# Patient Record
Sex: Female | Born: 1980 | Race: White | Hispanic: No | Marital: Married | Smoking: Current every day smoker
Health system: Southern US, Community
[De-identification: ages and names within clinical notes are randomized; demographics above are authoritative.]

---

## 2019-04-17 ENCOUNTER — Emergency Department: Payer: Medicaid Other

## 2019-04-17 ENCOUNTER — Other Ambulatory Visit: Payer: Self-pay

## 2019-04-17 ENCOUNTER — Emergency Department
Admission: EM | Admit: 2019-04-17 | Discharge: 2019-04-17 | Disposition: A | Discharge status: Home / Self Care | Payer: Medicaid Other | Attending: Emergency Medicine | Admitting: Emergency Medicine

## 2019-04-17 DIAGNOSIS — Z23 Encounter for immunization: Secondary | ICD-10-CM | POA: Diagnosis not present

## 2019-04-17 DIAGNOSIS — L03011 Cellulitis of right finger: Secondary | ICD-10-CM | POA: Diagnosis not present

## 2019-04-17 DIAGNOSIS — F17210 Nicotine dependence, cigarettes, uncomplicated: Secondary | ICD-10-CM | POA: Diagnosis not present

## 2019-04-17 DIAGNOSIS — L089 Local infection of the skin and subcutaneous tissue, unspecified: Secondary | ICD-10-CM | POA: Diagnosis present

## 2019-04-17 LAB — CBC WITH DIFFERENTIAL/PLATELET
Abs Immature Granulocytes: 0.06 K/uL (ref 0.00–0.07)
Basophils Absolute: 0.1 K/uL (ref 0.0–0.1)
Basophils Relative: 1 %
Eosinophils Absolute: 0.3 K/uL (ref 0.0–0.5)
Eosinophils Relative: 3 %
HCT: 40.3 % (ref 36.0–46.0)
Hemoglobin: 13.7 g/dL (ref 12.0–15.0)
Immature Granulocytes: 1 %
Lymphocytes Relative: 26 %
Lymphs Abs: 3.3 K/uL (ref 0.7–4.0)
MCH: 29.4 pg (ref 26.0–34.0)
MCHC: 34 g/dL (ref 30.0–36.0)
MCV: 86.5 fL (ref 80.0–100.0)
Monocytes Absolute: 0.9 K/uL (ref 0.1–1.0)
Monocytes Relative: 7 %
Neutro Abs: 7.8 K/uL — ABNORMAL HIGH (ref 1.7–7.7)
Neutrophils Relative %: 62 %
Platelets: 347 K/uL (ref 150–400)
RBC: 4.66 MIL/uL (ref 3.87–5.11)
RDW: 12.2 % (ref 11.5–15.5)
WBC: 12.5 K/uL — ABNORMAL HIGH (ref 4.0–10.5)
nRBC: 0 % (ref 0.0–0.2)

## 2019-04-17 LAB — COMPREHENSIVE METABOLIC PANEL
ALT: 12 U/L (ref 0–44)
AST: 11 U/L — ABNORMAL LOW (ref 15–41)
Albumin: 3.9 g/dL (ref 3.5–5.0)
Alkaline Phosphatase: 103 U/L (ref 38–126)
Anion gap: 9 (ref 5–15)
BUN: 17 mg/dL (ref 6–20)
CO2: 27 mmol/L (ref 22–32)
Calcium: 9.3 mg/dL (ref 8.9–10.3)
Chloride: 101 mmol/L (ref 98–111)
Creatinine, Ser: 0.52 mg/dL (ref 0.44–1.00)
GFR calc Af Amer: >60 mL/min (ref >60)
GFR calc non Af Amer: >60 mL/min (ref >60)
Glucose, Bld: 93 mg/dL (ref 70–99)
Potassium: 3.1 mmol/L — ABNORMAL LOW (ref 3.5–5.1)
Sodium: 137 mmol/L (ref 135–145)
Total Bilirubin: 0.8 mg/dL (ref 0.3–1.2)
Total Protein: 7.6 g/dL (ref 6.5–8.1)

## 2019-04-17 MED ORDER — HYDROCODONE-ACETAMINOPHEN 5-325 MG PO TABS
1.0000 | ORAL_TABLET | Freq: Once | ORAL | Status: AC
  Administered 2019-04-17: 1 via ORAL
  Filled 2019-04-17: qty 1

## 2019-04-17 MED ORDER — SULFAMETHOXAZOLE-TRIMETHOPRIM 800-160 MG PO TABS
1.0000 | ORAL_TABLET | Freq: Once | ORAL | Status: AC
  Administered 2019-04-17: 16:00:00 1 via ORAL
  Filled 2019-04-17: qty 1

## 2019-04-17 MED ORDER — LIDOCAINE HCL (PF) 1 % IJ SOLN
5.0000 mL | Freq: Once | INTRAMUSCULAR | Status: AC
  Administered 2019-04-17: 5 mL
  Filled 2019-04-17: qty 5

## 2019-04-17 MED ORDER — CEPHALEXIN 500 MG PO CAPS: 500.0000 mg | ORAL_CAPSULE | Freq: Three times a day (TID) | ORAL | 0 refills | Status: AC

## 2019-04-17 MED ORDER — HYDROCODONE-ACETAMINOPHEN 5-325 MG PO TABS: 1.0000 | ORAL_TABLET | Freq: Three times a day (TID) | ORAL | 0 refills | Status: AC | PRN

## 2019-04-17 MED ORDER — BUPIVACAINE HCL (PF) 0.5 % IJ SOLN
10.0000 mL | Freq: Once | INTRAMUSCULAR | Status: AC
  Administered 2019-04-17: 16:00:00 10 mL
  Filled 2019-04-17: qty 10

## 2019-04-17 MED ORDER — TETANUS-DIPHTH-ACELL PERTUSSIS 5-2.5-18.5 LF-MCG/0.5 IM SUSP
0.5000 mL | Freq: Once | INTRAMUSCULAR | Status: AC
  Administered 2019-04-17: 16:00:00 0.5 mL via INTRAMUSCULAR
  Filled 2019-04-17: qty 0.5

## 2019-04-17 MED ORDER — SULFAMETHOXAZOLE-TRIMETHOPRIM 800-160 MG PO TABS: 1.0000 | ORAL_TABLET | Freq: Two times a day (BID) | ORAL | 0 refills | Status: DC

## 2019-04-17 MED ORDER — CEPHALEXIN 500 MG PO CAPS
500.0000 mg | ORAL_CAPSULE | Freq: Once | ORAL | Status: AC
  Administered 2019-04-17: 16:00:00 500 mg via ORAL
  Filled 2019-04-17: qty 1

## 2019-04-17 NOTE — ED Triage Notes (Addendum)
Pt reports pain/swelling to right middle finger x2-3 days ago - the right middle finger appears to have discolored (yellow/green) blister to finger that is causing nail to start and protrude up - pt reports the nail/finger are painful and itching - pt reports that the pain and throbbing are extending up into her hand/wrist

## 2019-04-17 NOTE — ED Provider Notes (Signed)
[facility] Emergency Department Provider Note ____________________________________________  Time seen: 1456  I have reviewed the triage vital signs and the nursing notes.  HISTORY  Chief Complaint  Hand Pain  HPI Hannah Morse is a 39 y.o.right-handed female presents herself to the ED for evaluation of infection to the right middle fingertip. She admits to nail and cuticle biting. She presents with an infection which started at the cuticle and quickly spread to the entire dorsal fingertip, and now notes some tenderness to the fat pad. She denies any fevers, chills, or sweats.   See MEDIA tab - photos labeled Right Middle Finger 1 & 2  History reviewed. No pertinent past medical history.  There are no problems to display for this patient.  History reviewed. No pertinent surgical history.  Prior to Admission medications   Medication Sig Start Date End Date Taking? Authorizing Provider  cephALEXin (KEFLEX) 500 MG capsule Take 1 capsule (500 mg total) by mouth 3 (three) times daily for 7 days. 04/17/19 04/24/19  Galdino Hinchman, Charlesetta Ivory, PA-C  HYDROcodone-acetaminophen (NORCO) 5-325 MG tablet Take 1 tablet by mouth 3 (three) times daily as needed for up to 2 days. 04/17/19 04/19/19  Marck Mcclenny, Charlesetta Ivory, PA-C  sulfamethoxazole-trimethoprim (BACTRIM DS) 800-160 MG tablet Take 1 tablet by mouth 2 (two) times daily. 04/17/19   Lemuel Boodram, Charlesetta Ivory, PA-C    Allergies Patient has no known allergies.  History reviewed. No pertinent family history.  Social History Social History   Tobacco Use  . Smoking status: Current Every Day Smoker    Packs/day: 0.50    Types: Cigarettes  . Smokeless tobacco: Never Used  Substance Use Topics  . Alcohol use: Never  . Drug use: Yes    Types: Marijuana    Comment: last used 1 month ago     Review of Systems  Constitutional: Negative for fever. Cardiovascular: Negative for chest pain. Respiratory: Negative for  shortness of breath. Musculoskeletal: Negative for back pain. Skin: Negative for rash. Fingertip infection as above.  Neurological: Negative for headaches, focal weakness or numbness. ____________________________________________  PHYSICAL EXAM:  VITAL SIGNS: ED Triage Vitals  Enc Vitals Group     BP 04/17/19 1435 (!) 154/100     Pulse Rate 04/17/19 1435 93     Resp 04/17/19 1435 15     Temp 04/17/19 1435 98.2 F (36.8 C)     Temp Source 04/17/19 1435 Oral     SpO2 04/17/19 1435 100 %     Weight 04/17/19 1431 160 lb (72.6 kg)     Height 04/17/19 1431 5\' 4"  (1.626 m)     Head Circumference --      Peak Flow --      Pain Score 04/17/19 1431 6     Pain Loc --      Pain Edu? --      Excl. in GC? --     Constitutional: Alert and oriented. Well appearing and in no distress. Head: Normocephalic and atraumatic. Eyes: Conjunctivae are normal. Normal extraocular movements Cardiovascular: Normal rate, regular rhythm. Normal distal pulses. Respiratory: Normal respiratory effort.  Musculoskeletal: Nontender with normal range of motion in all extremities. Normal composite fist on the right hand Neurologic:  Normal gait without ataxia. Normal speech and language. No gross focal neurologic deficits are appreciated. Skin:  Skin is warm, dry and intact. No rash noted. Right middle finger with a large paronychia noted to the distal phalanx dorsally. It extends medially and laterally, and  causes some tension to the palmar fat pad. No lymphangitis, streaking, or spontaneous drainage noted.  Psychiatric: Mood and affect are normal. Patient exhibits appropriate insight and judgment. ____________________________________________   LABS (pertinent positives/negatives)  Labs Reviewed  CBC WITH DIFFERENTIAL/PLATELET - Abnormal; Notable for the following components:      Result Value   WBC 12.5 (*)    Neutro Abs 7.8 (*)    All other components within normal limits  COMPREHENSIVE METABOLIC PANEL -  Abnormal; Notable for the following components:   Potassium 3.1 (*)    AST 11 (*)    All other components within normal limits  AEROBIC CULTURE (SUPERFICIAL SPECIMEN)  ____________________________________________  RADIOLOGY  DG Right Middle Finger  IMPRESSION: Severe soft tissue swelling of the distal third finger. No bony abnormality identified. No foreign body.  I, Lissa Hoard, personally viewed and evaluated these images (plain radiographs) as part of my medical decision making, as well as reviewing the written report by the radiologist. ____________________________________________  PROCEDURES  Tdap 0.5 ml IM Bactrim DS 1 PO Keflex 500 mg PO Norco 5-325 mg PO  .Marland KitchenIncision and Drainage  Date/Time: 04/17/2019 5:21 PM Performed by: Lissa Hoard, PA-C Authorized by: Lissa Hoard, PA-C   Consent:    Consent obtained:  Verbal   Consent given by:  Patient   Risks discussed:  Bleeding, infection, incomplete drainage and pain   Alternatives discussed:  Alternative treatment, delayed treatment and observation Location:    Type:  Abscess (paronychia/felon)   Location:  Upper extremity   Upper extremity location:  Finger   Finger location:  R long finger Pre-procedure details:    Skin preparation:  Betadine Anesthesia (see MAR for exact dosages):    Anesthesia method:  Nerve block (transthecal block)   Block location:  Flexor tendon sheath   Block needle gauge:  27 G   Block anesthetic:  Bupivacaine 0.5% w/o epi and lidocaine 1% w/o epi   Block technique:  Flexon tendon sheath   Block injection procedure:  Anatomic landmarks identified, anatomic landmarks palpated, introduced needle and incremental injection   Block outcome:  Anesthesia achieved Procedure type:    Complexity:  Complex Procedure details:    Needle aspiration: no     Incision types:  Single straight (fat pad (medial & lateral) & paronychia (dorsally))   Incision depth:   Subcutaneous   Scalpel blade:  11   Wound management:  Probed and deloculated   Drainage:  Purulent   Drainage amount:  Moderate   Wound treatment:  Wound left open   Packing materials:  None Post-procedure details:    Patient tolerance of procedure:  Tolerated well, no immediate complications   ____________________________________________  INITIAL IMPRESSION / ASSESSMENT AND PLAN / ED COURSE  Patient presents to the ED for evaluation of infection to the right middle finger.  She presents with a moderate paronychia to the right middle finger dorsally concerning for early felon to the fat pad.  Patient agrees to undergo a local I&D procedure which nets meaningful improvement to her paronychia.  She is also has 2 drainage lines placed to the fat pad for felon management.  She will be treated empirically with Keflex and Bactrim for infection management.  A small prescription for hydrocodone provided for pain control.  She is discharged with wound care instructions and supplies, and will monitor the wound closely.  She will return to the ED immediately for any signs of worsening infection.  Eliyanah Elgersma was  evaluated in Emergency Department on 04/17/2019 for the symptoms described in the history of present illness. She was evaluated in the context of the global COVID-19 pandemic, which necessitated consideration that the patient might be at risk for infection with the SARS-CoV-2 virus that causes COVID-19. Institutional protocols and algorithms that pertain to the evaluation of patients at risk for COVID-19 are in a state of rapid change based on information released by regulatory bodies including the CDC and federal and state organizations. These policies and algorithms were followed during the patient's care in the ED.  No current RX noted on the [place] Controlled Substances Database. ____________________________________________  FINAL CLINICAL IMPRESSION(S) / ED DIAGNOSES  Final diagnoses:   Paronychia of right middle finger  Felon of finger of right hand      Carmie End, Dannielle Karvonen, PA-C 04/17/19 1832    Harvest Dark, MD 04/19/19 2127

## 2019-04-17 NOTE — Discharge Instructions (Signed)
You have had your finger infection treated with an incision and drainage procedure. You should keep the finger clean, dry, and covered. Change the dressing daily. Perform saline soaks to promote healing. Take the two antibiotics as directed and the pain medicine as needed. Return to the ED for wound check as discussed.

## 2019-04-17 NOTE — ED Notes (Signed)
Discussed pt with [facility] - order to start IV/collect blood and order xray of finger then flex

## 2019-04-19 LAB — AEROBIC CULTURE W GRAM STAIN (SUPERFICIAL SPECIMEN)

## 2020-04-13 ENCOUNTER — Emergency Department
Admission: EM | Admit: 2020-04-13 | Discharge: 2020-04-13 | Disposition: A | Discharge status: Home / Self Care | Payer: Medicaid Other | Attending: Emergency Medicine | Admitting: Emergency Medicine

## 2020-04-13 ENCOUNTER — Other Ambulatory Visit: Payer: Self-pay

## 2020-04-13 DIAGNOSIS — F1510 Other stimulant abuse, uncomplicated: Secondary | ICD-10-CM | POA: Diagnosis not present

## 2020-04-13 DIAGNOSIS — R197 Diarrhea, unspecified: Secondary | ICD-10-CM | POA: Diagnosis present

## 2020-04-13 DIAGNOSIS — F17210 Nicotine dependence, cigarettes, uncomplicated: Secondary | ICD-10-CM | POA: Diagnosis not present

## 2020-04-13 DIAGNOSIS — Z20822 Contact with and (suspected) exposure to covid-19: Secondary | ICD-10-CM | POA: Diagnosis not present

## 2020-04-13 DIAGNOSIS — K529 Noninfective gastroenteritis and colitis, unspecified: Secondary | ICD-10-CM | POA: Insufficient documentation

## 2020-04-13 DIAGNOSIS — E876 Hypokalemia: Secondary | ICD-10-CM | POA: Insufficient documentation

## 2020-04-13 DIAGNOSIS — E860 Dehydration: Secondary | ICD-10-CM | POA: Insufficient documentation

## 2020-04-13 LAB — URINALYSIS, COMPLETE (UACMP) WITH MICROSCOPIC
Bilirubin Urine: NEGATIVE
Glucose, UA: NEGATIVE mg/dL
Hgb urine dipstick: NEGATIVE
Ketones, ur: NEGATIVE mg/dL
Leukocytes,Ua: NEGATIVE
Nitrite: NEGATIVE
Protein, ur: NEGATIVE mg/dL
Specific Gravity, Urine: 1.002 — ABNORMAL LOW (ref 1.005–1.030)
pH: 5 (ref 5.0–8.0)

## 2020-04-13 LAB — CBC WITH DIFFERENTIAL/PLATELET
Abs Immature Granulocytes: 0.02 10*3/uL (ref 0.00–0.07)
Basophils Absolute: 0.1 10*3/uL (ref 0.0–0.1)
Basophils Relative: 1 %
Eosinophils Absolute: 0.1 10*3/uL (ref 0.0–0.5)
Eosinophils Relative: 1 %
HCT: 39.6 % (ref 36.0–46.0)
Hemoglobin: 13.7 g/dL (ref 12.0–15.0)
Immature Granulocytes: 0 %
Lymphocytes Relative: 15 %
Lymphs Abs: 1.2 10*3/uL (ref 0.7–4.0)
MCH: 30 pg (ref 26.0–34.0)
MCHC: 34.6 g/dL (ref 30.0–36.0)
MCV: 86.7 fL (ref 80.0–100.0)
Monocytes Absolute: 1.2 10*3/uL — ABNORMAL HIGH (ref 0.1–1.0)
Monocytes Relative: 14 %
Neutro Abs: 5.6 10*3/uL (ref 1.7–7.7)
Neutrophils Relative %: 69 %
Platelets: 377 10*3/uL (ref 150–400)
RBC: 4.57 MIL/uL (ref 3.87–5.11)
RDW: 12.6 % (ref 11.5–15.5)
Smear Review: NORMAL
WBC: 8.1 10*3/uL (ref 4.0–10.5)
nRBC: 0 % (ref 0.0–0.2)

## 2020-04-13 LAB — COMPREHENSIVE METABOLIC PANEL
ALT: 14 U/L (ref 0–44)
AST: 16 U/L (ref 15–41)
Albumin: 3.8 g/dL (ref 3.5–5.0)
Alkaline Phosphatase: 79 U/L (ref 38–126)
Anion gap: 11 (ref 5–15)
BUN: 18 mg/dL (ref 6–20)
CO2: 24 mmol/L (ref 22–32)
Calcium: 9.1 mg/dL (ref 8.9–10.3)
Chloride: 98 mmol/L (ref 98–111)
Creatinine, Ser: 0.71 mg/dL (ref 0.44–1.00)
GFR, Estimated: >60 mL/min (ref >60)
Glucose, Bld: 98 mg/dL (ref 70–99)
Potassium: 3.4 mmol/L — ABNORMAL LOW (ref 3.5–5.1)
Sodium: 133 mmol/L — ABNORMAL LOW (ref 135–145)
Total Bilirubin: 1.2 mg/dL (ref 0.3–1.2)
Total Protein: 7.4 g/dL (ref 6.5–8.1)

## 2020-04-13 LAB — LIPASE, BLOOD: Lipase: 22 U/L (ref 11–51)

## 2020-04-13 LAB — LACTIC ACID, PLASMA: Lactic Acid, Venous: 1.5 mmol/L (ref 0.5–1.9)

## 2020-04-13 LAB — SARS CORONAVIRUS 2 BY RT PCR (HOSPITAL ORDER, PERFORMED IN ~~LOC~~ HOSPITAL LAB): SARS Coronavirus 2: NEGATIVE

## 2020-04-13 LAB — TROPONIN I (HIGH SENSITIVITY): Troponin I (High Sensitivity): 8 ng/L (ref <18)

## 2020-04-13 LAB — MAGNESIUM: Magnesium: 1.6 mg/dL — ABNORMAL LOW (ref 1.7–2.4)

## 2020-04-13 LAB — HIV ANTIBODY (ROUTINE TESTING W REFLEX): HIV Screen 4th Generation wRfx: NONREACTIVE

## 2020-04-13 LAB — POC URINE PREG, ED: Preg Test, Ur: NEGATIVE

## 2020-04-13 MED ORDER — POTASSIUM CHLORIDE CRYS ER 20 MEQ PO TBCR
40.0000 meq | EXTENDED_RELEASE_TABLET | Freq: Once | ORAL | Status: AC
  Administered 2020-04-13: 40 meq via ORAL
  Filled 2020-04-13: qty 2

## 2020-04-13 MED ORDER — MAGNESIUM SULFATE 2 GM/50ML IV SOLN
2.0000 g | Freq: Once | INTRAVENOUS | Status: AC
  Administered 2020-04-13: 2 g via INTRAVENOUS
  Filled 2020-04-13: qty 50

## 2020-04-13 MED ORDER — ACETAMINOPHEN 325 MG PO TABS
650.0000 mg | ORAL_TABLET | Freq: Once | ORAL | Status: AC | PRN
  Administered 2020-04-13: 650 mg via ORAL
  Filled 2020-04-13: qty 2

## 2020-04-13 MED ORDER — ONDANSETRON HCL 4 MG PO TABS: 4.0000 mg | ORAL_TABLET | Freq: Three times a day (TID) | ORAL | 0 refills | Status: AC | PRN

## 2020-04-13 MED ORDER — ONDANSETRON 4 MG PO TBDP
4.0000 mg | ORAL_TABLET | Freq: Once | ORAL | Status: AC
  Administered 2020-04-13: 4 mg via ORAL
  Filled 2020-04-13: qty 1

## 2020-04-13 MED ORDER — LACTATED RINGERS IV BOLUS (SEPSIS)
30.0000 mL/kg | Freq: Once | INTRAVENOUS | Status: AC
  Administered 2020-04-13: 2040 mL via INTRAVENOUS

## 2020-04-13 NOTE — ED Triage Notes (Signed)
Reports NVD and generalized body aching that started approx 24 hours ago.

## 2020-04-13 NOTE — ED Notes (Signed)
D/C and new RX discussed with pt, pt verbalized understanding. NAD noted. Pt denies any further questions for this RN. Pt educated on stopping meth use.

## 2020-04-13 NOTE — ED Provider Notes (Signed)
[facility] Emergency Department Provider Note  ____________________________________________   Event Date/Time   First MD Initiated Contact with Patient 04/13/20 [phone]     (approximate)  I have reviewed the triage vital signs and the nursing notes.   HISTORY  Chief Complaint Emesis and Diarrhea   HPI Hannah Morse is a 40 y.o. female with a past medical history of methamphetamine abuse stating she last used about 1 week ago as well as chronic soreness in her right shoulder with the last 2 years who presents for assessment of approximately 6 to 8 hours of nonbloody nonbilious vomiting, nonbloody diarrhea, myalgias, fevers and general malaise.  She denies any headache, earache, sore throat, cough, chest pain, shortness of breath, dental pain, back pain, rash, urinary symptoms, or any other acute sick symptoms.  Denies any recent injuries or falls.  She denies regular EtOH use or other illicit drug use.  She does no tobacco abuse.  No clearly getting a rating factors.  Patient does note she is homeless.         History reviewed. No pertinent past medical history.  There are no problems to display for this patient.   History reviewed. No pertinent surgical history.  Prior to Admission medications   Medication Sig Start Date End Date Taking? Authorizing Provider  ondansetron (ZOFRAN) 4 MG tablet Take 1 tablet (4 mg total) by mouth every 8 (eight) hours as needed for up to 10 doses for nausea or vomiting. 04/13/20  Yes Gilles Chiquito, MD    Allergies Patient has no known allergies.  No family history on file.  Social History Social History   Tobacco Use  . Smoking status: Current Every Day Smoker    Packs/day: 0.50    Types: Cigarettes  . Smokeless tobacco: Never Used  Substance Use Topics  . Alcohol use: Never  . Drug use: Yes    Types: Marijuana    Comment: last used 1 month ago     Review of Systems  Review of Systems   Constitutional: Positive for chills, fever and malaise/fatigue.  HENT: Negative for sore throat.   Eyes: Negative for pain.  Respiratory: Negative for cough and stridor.   Cardiovascular: Negative for chest pain.  Gastrointestinal: Positive for diarrhea, nausea and vomiting.  Genitourinary: Negative for dysuria.  Musculoskeletal: Positive for myalgias.  Skin: Negative for rash.  Neurological: Negative for seizures, loss of consciousness and headaches.  Psychiatric/Behavioral: Negative for suicidal ideas.  All other systems reviewed and are negative.     ____________________________________________   PHYSICAL EXAM:  VITAL SIGNS: ED Triage Vitals [04/13/20 0829]  Enc Vitals Group     BP (!) 102/54     Pulse Rate (!) 101     Resp 18     Temp (!) 101.2 F (38.4 C)     Temp Source Oral     SpO2 100 %     Weight 150 lb (68 kg)     Height 5\' 4"  (1.626 m)     Head Circumference      Peak Flow      Pain Score 6     Pain Loc      Pain Edu?      Excl. in GC?    Vitals:   04/13/20 1230 04/13/20 1300  BP: 131/78 119/79  Pulse: 92 93  Resp: (!) 25 (!) 26  Temp:    SpO2: 100% 100%   Physical Exam Vitals and nursing note reviewed.  Constitutional:  General: She is not in acute distress.    Appearance: She is well-developed and well-nourished. She is ill-appearing.  HENT:     Head: Normocephalic and atraumatic.     Right Ear: External ear normal.     Left Ear: External ear normal.     Nose: Nose normal.     Mouth/Throat:     Mouth: Mucous membranes are dry.  Eyes:     Conjunctiva/sclera: Conjunctivae normal.  Cardiovascular:     Rate and Rhythm: Normal rate and regular rhythm.     Heart sounds: No murmur heard.   Pulmonary:     Effort: Pulmonary effort is normal. No respiratory distress.     Breath sounds: Normal breath sounds.  Abdominal:     Palpations: Abdomen is soft.     Tenderness: There is no abdominal tenderness. There is no right CVA tenderness or  left CVA tenderness.  Musculoskeletal:        General: No edema.     Cervical back: Neck supple.  Skin:    General: Skin is warm and dry.     Capillary Refill: Capillary refill takes more than 3 seconds.  Neurological:     Mental Status: She is alert and oriented to person, place, and time.  Psychiatric:        Mood and Affect: Mood and affect and mood normal.      ____________________________________________   LABS (all labs ordered are listed, but only abnormal results are displayed)  Labs Reviewed  CBC WITH DIFFERENTIAL/PLATELET - Abnormal; Notable for the following components:      Result Value   Monocytes Absolute 1.2 (*)    All other components within normal limits  COMPREHENSIVE METABOLIC PANEL - Abnormal; Notable for the following components:   Sodium 133 (*)    Potassium 3.4 (*)    All other components within normal limits  URINALYSIS, COMPLETE (UACMP) WITH MICROSCOPIC - Abnormal; Notable for the following components:   Color, Urine STRAW (*)    APPearance CLEAR (*)    Specific Gravity, Urine 1.002 (*)    Bacteria, UA RARE (*)    All other components within normal limits  MAGNESIUM - Abnormal; Notable for the following components:   Magnesium 1.6 (*)    All other components within normal limits  SARS CORONAVIRUS 2 BY RT PCR (HOSPITAL ORDER, PERFORMED IN [facility])  LIPASE, BLOOD  LACTIC ACID, PLASMA  HIV ANTIBODY (ROUTINE TESTING W REFLEX)  POC URINE PREG, ED  TROPONIN I (HIGH SENSITIVITY)  TROPONIN I (HIGH SENSITIVITY)   ____________________________________________  EKG  Sinus rhythm with a ventricular of 94, normal axis, normal intervals, and some ST changes that are nonspecific in anterior and inferior leads.  No other significant arrhythmia. ____________________________________________  RADIOLOGY  ED MD interpretation:  Official radiology report(s): No results  found.  ____________________________________________   PROCEDURES  Procedure(s) performed (including Critical Care):  .1-3 Lead EKG Interpretation Performed by: Gilles Chiquito, MD Authorized by: Gilles Chiquito, MD     Interpretation: normal     ECG rate assessment: normal     Rhythm: sinus rhythm     Ectopy: none     Conduction: normal       ____________________________________________   INITIAL IMPRESSION / ASSESSMENT AND PLAN / ED COURSE      Patient presents above to history exam for assessment of acute onset of nausea vomiting diarrhea associate with myalgias and fever.  On arrival she is febrile to 1.2, tachycardic  to 101 and borderline hypotensive with a BP of 102/54 with otherwise stable vital signs on room air.  She does appear very dry with dry mucous membranes and decreased capillary refill.  Oropharynx is unremarkable.  PERRLA.  EOMI.  Lungs clear bilaterally and abdomen soft nontender.  Upper extremities unremarkable for evidence of cellulitis states he injects no other obvious foci of infection on exam.  Differential includes but is not limited to infectious gastroenteritis, cholecystitis, pancreatitis, diverticulitis, appendicitis, pyelonephritis and cystitis.  No cough shortness of breath or abnormal lung sounds to suggest acute bacterial pneumonia at this time.  Will ECG does have some nonspecific changes patient adamantly denies any chest pain given nonelevated troponin obtained greater than 2 hours after symptom onset low suspicion for ACS or myocarditis at this time.  CBC shows no leukocytosis or acute anemia.  Otherwise unremarkable.  CMP shows no significant electrolyte or metabolic derangements aside from mild hypokalemia.  This was repleted.  No evidence of cholestasis hepatitis or other abnormalities.  Lipase 22 not consistent with Pancreatitis.  Magnesium is 1.6.  This was repleted.  Urine transit test is negative.  Lactic acid is less than 3.  Low  suspicion for sepsis or bacteremia at this time.  Covid is negative.  Impression is likely dehydration secondary to viral infectious gastroenteritis.  Patient given IV fluids electrolytes Zofran and on reassessment had resolution of her fever and tachycardia..  Improvement in vital signs with reassuring exam and work-up patient stating she felt much better I believe she is safe for discharge plan for outpatient follow-up.  Discussed that she is a pending HIV test and she can follow-up as result online or with her PCP.  Advised close outpatient PCP follow-up.  Discharged stable condition.  Strict precautions advised and discussed.  Advised to discontinue any methamphetamine use.  ____________________________________________   FINAL CLINICAL IMPRESSION(S) / ED DIAGNOSES  Final diagnoses:  Gastroenteritis  Dehydration  Hypomagnesemia  Hypokalemia  Methamphetamine abuse (HCC)    Medications  acetaminophen (TYLENOL) tablet 650 mg (650 mg Oral Given 04/13/20 0846)  ondansetron (ZOFRAN-ODT) disintegrating tablet 4 mg (4 mg Oral Given 04/13/20 0847)  lactated ringers bolus 2,040 mL (0 mL/kg  68 kg Intravenous Stopped 04/13/20 1222)  magnesium sulfate IVPB 2 g 50 mL (0 g Intravenous Stopped 04/13/20 1222)  potassium chloride SA (KLOR-CON) CR tablet 40 mEq (40 mEq Oral Given 04/13/20 1106)     ED Discharge Orders         Ordered    ondansetron (ZOFRAN) 4 MG tablet  Every 8 hours PRN        04/13/20 1308           Note:  This document was prepared using Dragon voice recognition software and may include unintentional dictation errors.   Gilles Chiquito, MD 04/13/20 [phone]

## 2020-04-13 NOTE — Discharge Instructions (Signed)
I suspect you likely have a viral infection.  Please follow-up results of your pending HIV test with your primary care doctor you may look up the results online.  Strongly recommend you discontinue any methamphetamine use discussed with your primary care doctor possible counseling regarding substance abuse.  Please return immediately to the emergency room if you experience any new symptoms or acute worsening of your current symptoms.

## 2020-04-13 NOTE — ED Notes (Signed)
First Rn note:  Pt comes into the ED via [facility] c/o N/V/D.  Pt states it started yesterday.  Denies any abdominal pain.  132/84, 116 HR, 98% RA.

## 2020-04-13 NOTE — ED Notes (Addendum)
Pt presents to the ED for generalized weakness, fatigue, NVD. Pt states she has been feeling like this since yesterday. On assessment, pt is very lethargic. Pt keeps repeating, "Im just tired and dehydrated."

## 2021-05-11 IMAGING — DX DG FINGER MIDDLE 2+V*R*
3 series · 3 of 3 positions shown · non-contrast
Comparison: None.

CLINICAL DATA: Swelling and pain.

EXAM:
RIGHT MIDDLE FINGER 2+V

[finger ap]
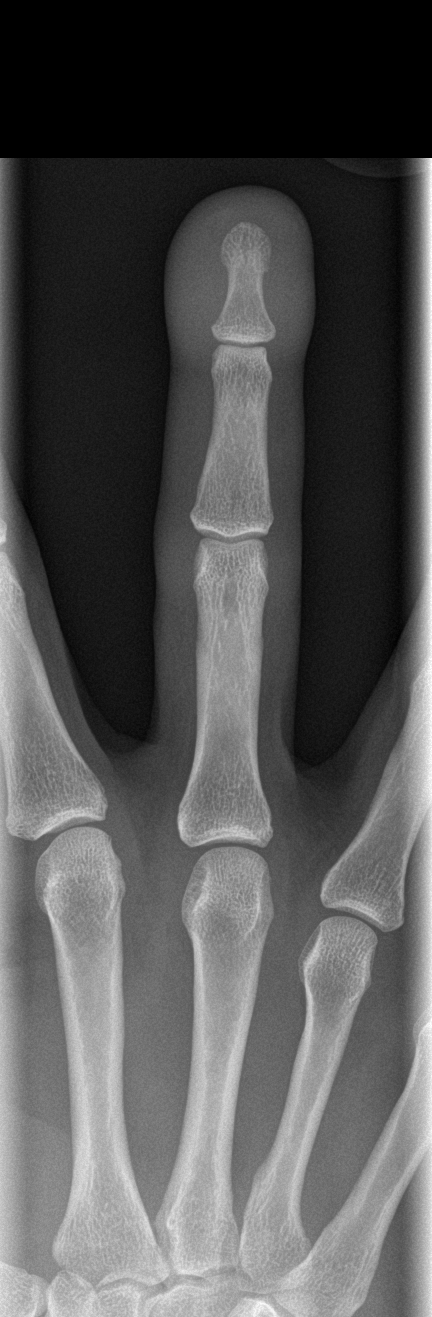

[finger obl]
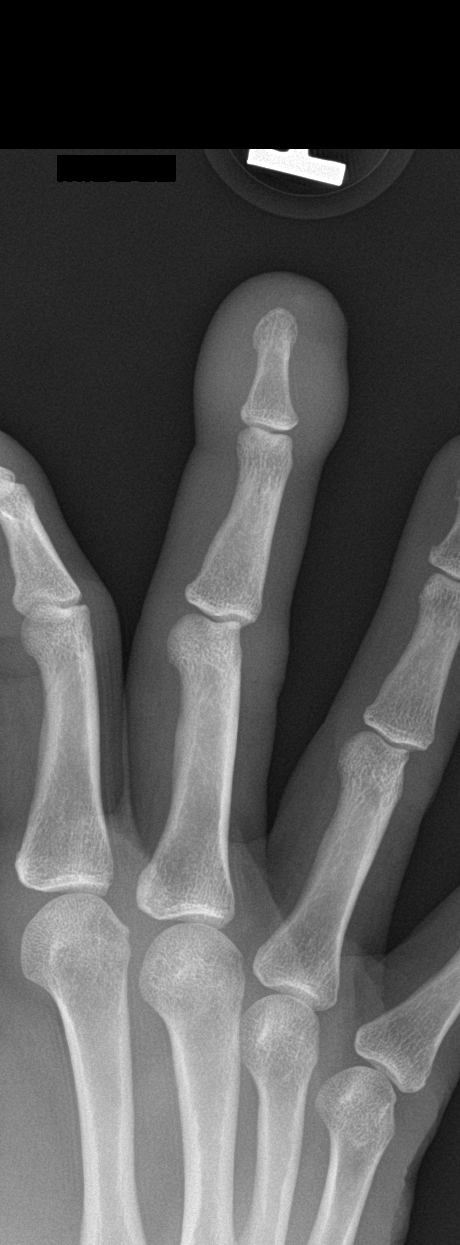

[finger lat]
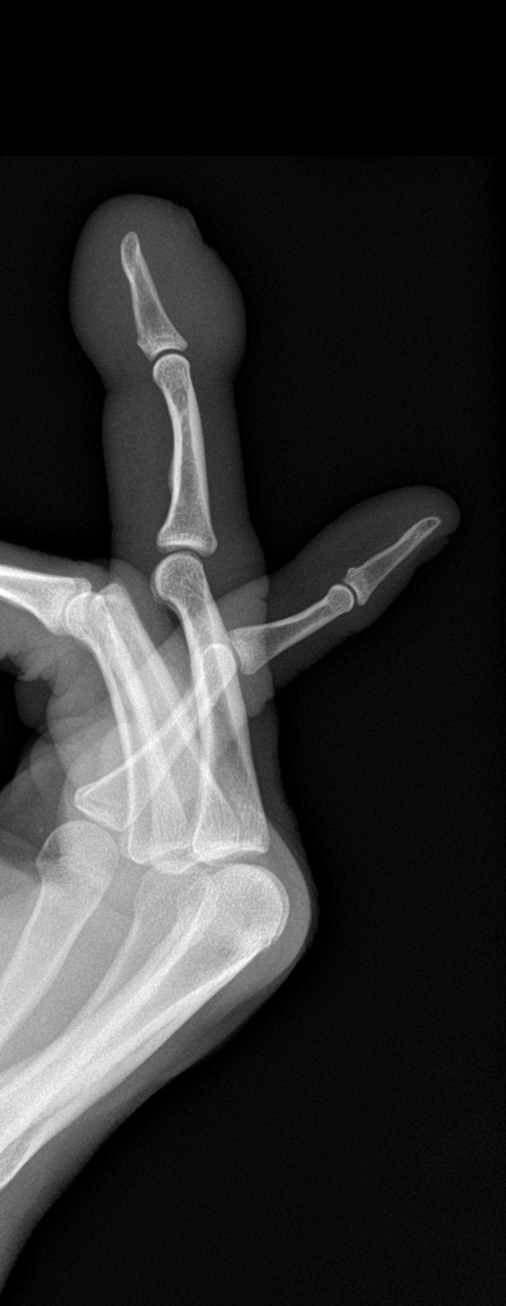

[3 of 3 positions shown; findings below may reference images not displayed]

FINDINGS: Marked soft tissue swelling is seen in the distal third finger. No
foreign body or soft tissue gas is identified. The underlying bone
is intact with no erosion. No fractures are seen.
IMPRESSION: Severe soft tissue swelling of the distal third finger. No bony
abnormality identified. No foreign body.
